# Patient Record
Sex: Female | Born: 2000 | Hispanic: Yes | Marital: Single | Smoking: Never smoker
Health system: Southern US, Community
[De-identification: ages and names within clinical notes are randomized; demographics above are authoritative.]

## PROBLEM LIST (undated history)

## (undated) DIAGNOSIS — F419 Anxiety disorder, unspecified: Secondary | ICD-10-CM

## (undated) HISTORY — DX: Anxiety disorder, unspecified: F41.9

## (undated) HISTORY — PX: NO PAST SURGERIES: SHX2092

---
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
## 2018-06-17 IMAGING — DX DG CHEST 2V
2 series · 2 of 2 positions shown · non-contrast
Comparison: None.

CLINICAL DATA: Cough, fever, chills and body aches.

EXAM:
CHEST  2 VIEW

[chest pa]
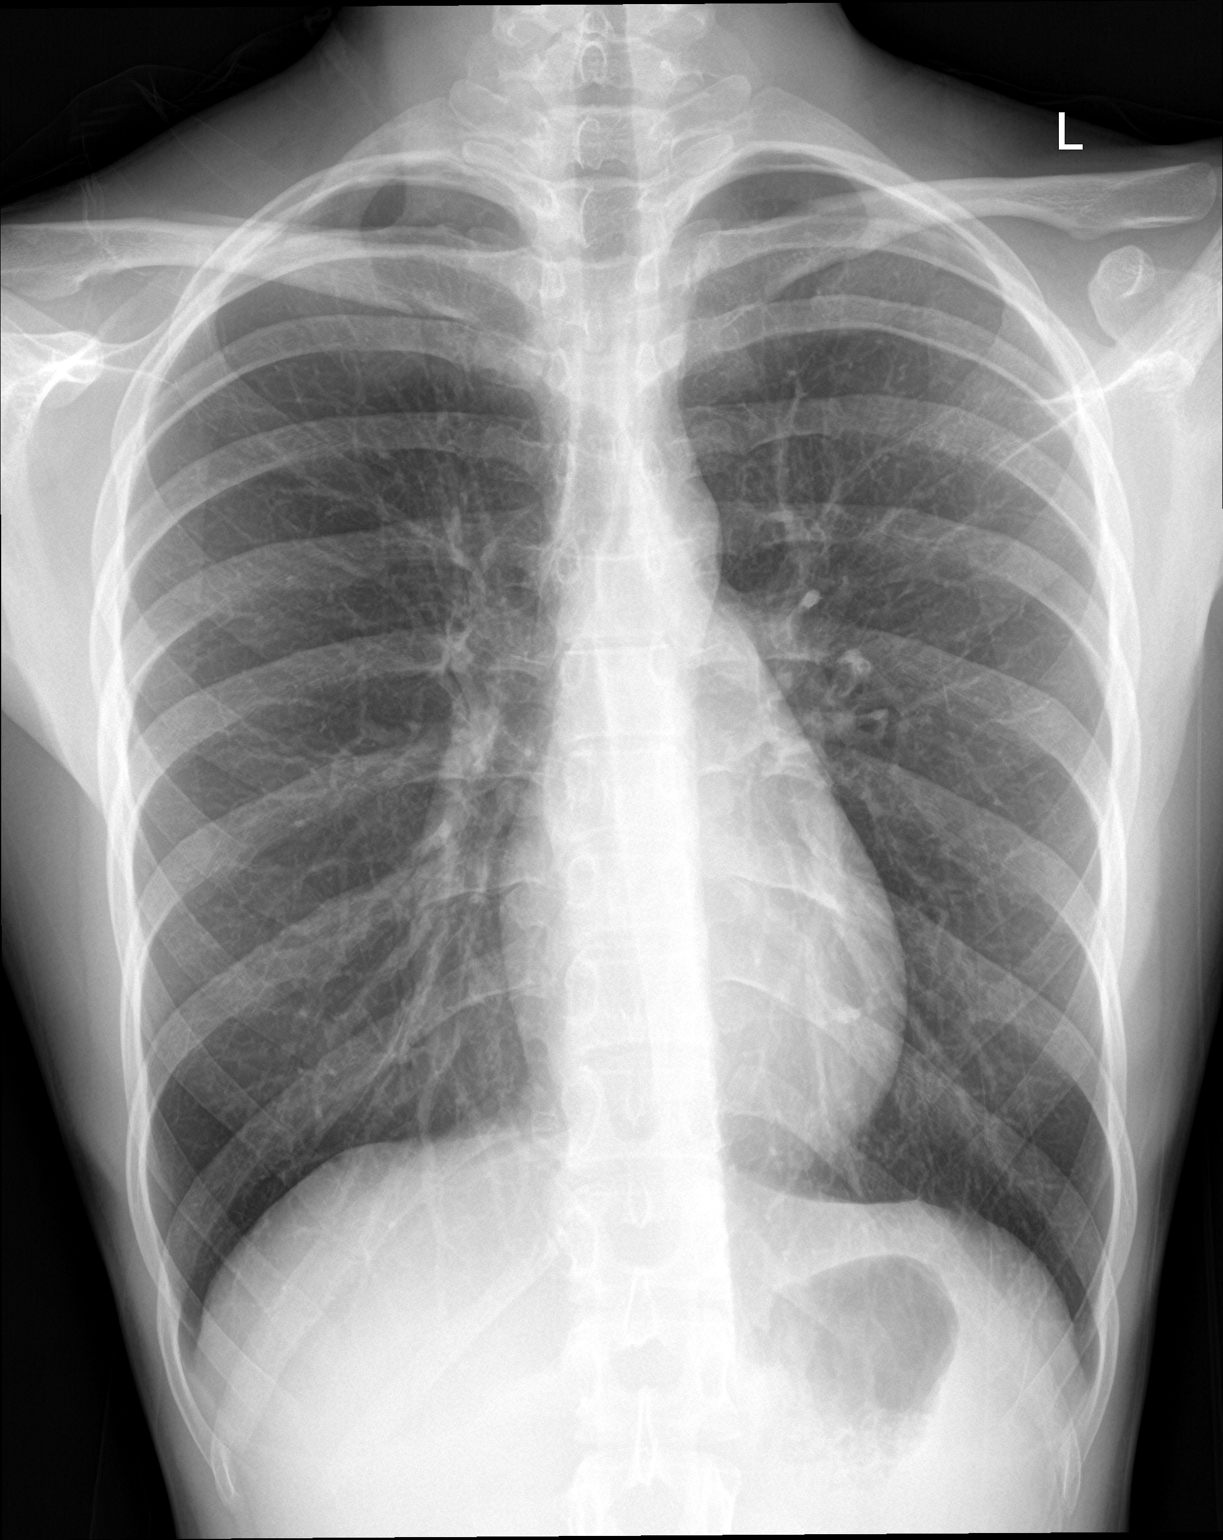

[chest lat]
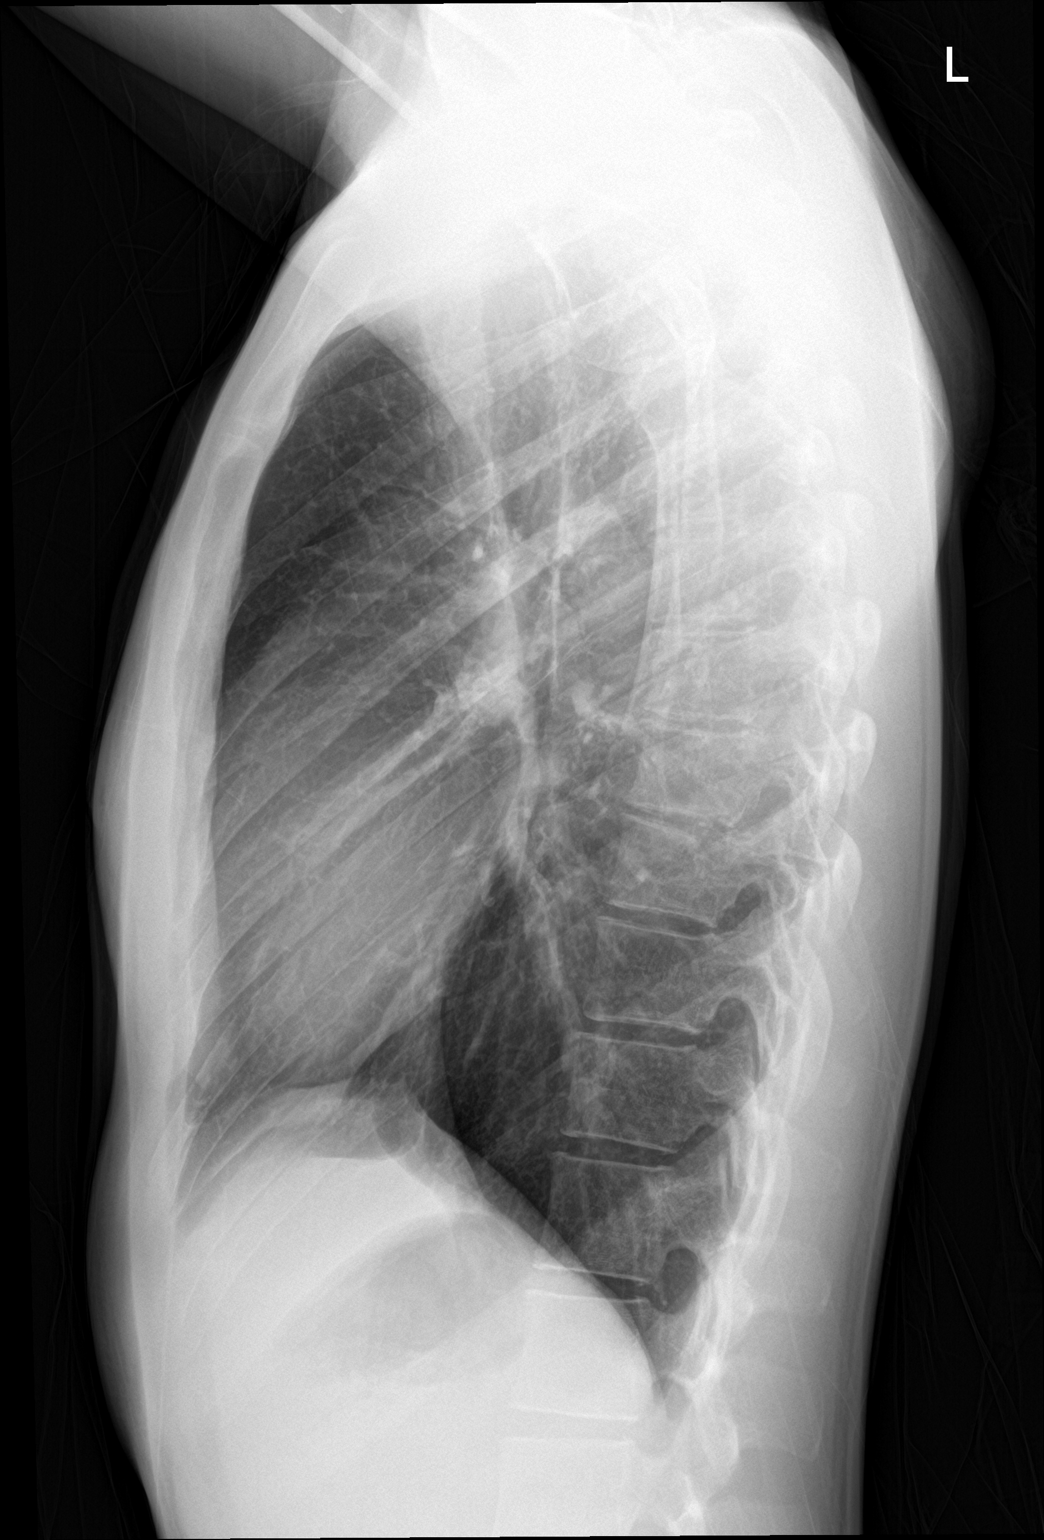

[2 of 2 positions shown; findings below may reference images not displayed]

FINDINGS: The cardiomediastinal contours are normal. The lungs are clear.
Pulmonary vasculature is normal. No consolidation, pleural effusion,
or pneumothorax. No acute osseous abnormalities are seen.
IMPRESSION: No active cardiopulmonary disease.

## 2020-06-05 ENCOUNTER — Ambulatory Visit: Payer: Self-pay | Admitting: Obstetrics and Gynecology

## 2020-07-16 ENCOUNTER — Encounter: Payer: Self-pay | Admitting: Obstetrics and Gynecology

## 2020-07-16 ENCOUNTER — Other Ambulatory Visit: Payer: Self-pay

## 2020-07-16 ENCOUNTER — Ambulatory Visit (INDEPENDENT_AMBULATORY_CARE_PROVIDER_SITE_OTHER): Payer: Medicaid Other | Admitting: Obstetrics and Gynecology

## 2020-07-16 ENCOUNTER — Other Ambulatory Visit (HOSPITAL_COMMUNITY)
Admission: RE | Admit: 2020-07-16 | Discharge: 2020-07-16 | Disposition: A | Discharge status: Home / Self Care | Payer: Medicaid Other | Source: Ambulatory Visit | Attending: Obstetrics and Gynecology | Admitting: Obstetrics and Gynecology

## 2020-07-16 VITALS — BP 105/67 | HR 97 | Ht 65.0 in | Wt 116.0 lb

## 2020-07-16 DIAGNOSIS — N898 Other specified noninflammatory disorders of vagina: Secondary | ICD-10-CM

## 2020-07-16 DIAGNOSIS — Z3009 Encounter for other general counseling and advice on contraception: Secondary | ICD-10-CM

## 2020-07-16 DIAGNOSIS — Z113 Encounter for screening for infections with a predominantly sexual mode of transmission: Secondary | ICD-10-CM | POA: Insufficient documentation

## 2020-07-16 DIAGNOSIS — R6882 Decreased libido: Secondary | ICD-10-CM

## 2020-07-16 DIAGNOSIS — Z01419 Encounter for gynecological examination (general) (routine) without abnormal findings: Secondary | ICD-10-CM | POA: Diagnosis not present

## 2020-07-16 NOTE — Progress Notes (Addendum)
Referral from [facility]: New GYN presents for pelvic pain, UTI after sex, diminished sex drive x 3 years. She is on DEPO for [facility].  Patient states that all her Urine Cultures have been Negative.  PHQ-9=14

## 2020-07-16 NOTE — Progress Notes (Signed)
GYNECOLOGY ANNUAL PREVENTATIVE CARE ENCOUNTER NOTE  Subjective:   Sheryl Mcconnell is a 19 y.o. G0P0000 female here for a annual gynecologic exam. Current complaints: frequent UTI and low libido.    Pt reports she had 6-7 UTIs in a year, was diagnosed by PCP the first time by culture. One other time, she had a positive urine culture and was treated. All the other times, she called and requested antibiotics and did not have culture positive. Was sent to Urology, who told her she did not have UTIs and to see GYN. Last had symptoms several weeks prior to Urology appointment.   Also concerned about libido. Had some libido age [age] but since then, has very low sex drive. Doesn't ever have any desire. Uses vibrator during intercourse for climax. States she orgasms very quickly once she "feels something." Is interfering with her relationship because she has no desire and no desire for sex. Has to use lube with intercourse.   Also has "bumps" that appear in her skin, once a month, one at a time. Painful with pressure.  Has been on OCPs since age 62, has been on Depo for 4 months. No UTI since switching to Depo.    Denies abnormal vaginal bleeding, discharge, pelvic pain, problems with intercourse or other gynecologic concerns.    Gynecologic History No LMP recorded. Patient has had an injection. Contraception: Depo-Provera injections Last Pap: n/a Last mammogram: n/a Gardisil: has not received  Obstetric History OB History  Gravida Para Term Preterm AB Living  0 0 0 0 0 0  SAB TAB Ectopic Multiple Live Births  0 0 0 0 0    Past Medical History:  Diagnosis Date  . Anxiety     Past Surgical History:  Procedure Laterality Date  . NO PAST SURGERIES      No current outpatient medications on file prior to visit.   No current facility-administered medications on file prior to visit.    No Known Allergies  Social History   Socioeconomic History  . Marital status: Single     Spouse name: Not on file  . Number of children: Not on file  . Years of education: Not on file  . Highest education level: Not on file  Occupational History  . Not on file  Tobacco Use  . Smoking status: Never Smoker  . Smokeless tobacco: Never Used  Substance and Sexual Activity  . Alcohol use: No  . Drug use: No  . Sexual activity: Yes    Birth control/protection: Injection  Other Topics Concern  . Not on file  Social History Narrative  . Not on file   Social Determinants of Health   Financial Resource Strain:   . Difficulty of Paying Living Expenses:   Food Insecurity:   . Worried About Programme researcher, broadcasting/film/video in the Last Year:   . Barista in the Last Year:   Transportation Needs:   . Freight forwarder (Medical):   Marland Kitchen Lack of Transportation (Non-Medical):   Physical Activity:   . Days of Exercise per Week:   . Minutes of Exercise per Session:   Stress:   . Feeling of Stress :   Social Connections:   . Frequency of Communication with Friends and Family:   . Frequency of Social Gatherings with Friends and Family:   . Attends Religious Services:   . Active Member of Clubs or Organizations:   . Attends Banker Meetings:   Marland Kitchen Marital  Status:   Intimate Partner Violence:   . Fear of Current or Ex-Partner:   . Emotionally Abused:   Marland Kitchen Physically Abused:   . Sexually Abused:     Family History  Problem Relation Age of Onset  . Cancer Paternal Uncle   . Cancer Maternal Grandmother   . Cancer Paternal Grandmother   . Cancer Paternal Grandfather     The following portions of the patient's history were reviewed and updated as appropriate: allergies, current medications, past family history, past medical history, past social history, past surgical history and problem list.  Review of Systems Pertinent items are noted in HPI.   Objective:  BP 105/67   Pulse 97   Ht 5\' 5"  (1.651 m)   Wt 116 lb (52.6 kg)   BMI 19.30 kg/m  CONSTITUTIONAL:  Well-developed, well-nourished female in no acute distress.  HENT:  Normocephalic, atraumatic, External right and left ear normal. Oropharynx is clear and moist EYES: Conjunctivae and EOM are normal. Pupils are equal, round, and reactive to light. No scleral icterus.  NECK: Normal range of motion, supple, no masses.  Normal thyroid.  SKIN: Skin is warm and dry. No rash noted. Not diaphoretic. No erythema. No pallor. NEUROLOGIC: Alert and oriented to person, place, and time. Normal reflexes, muscle tone coordination. No cranial nerve deficit noted. PSYCHIATRIC: Normal mood and affect. Normal behavior. Normal judgment and thought content. CARDIOVASCULAR: Normal heart rate noted RESPIRATORY: Effort normal, no problems with respiration noted. BREASTS: Symmetric in size. No masses, skin changes, nipple drainage, or lymphadenopathy. ABDOMEN: Soft, no distention noted.  No tenderness, rebound or guarding.  PELVIC: Normal appearing external genitalia; normal appearing vaginal mucosa and cervix.  No abnormal discharge noted.  Pelvic cultures obtained. Normal uterine size, no other palpable masses, no uterine or adnexal tenderness. MUSCULOSKELETAL: Normal range of motion. No tenderness.  No cyanosis, clubbing, or edema.  2+ distal pulses.  Exam done with chaperone present.  Assessment and Plan:   1. Routine screening for STI (sexually transmitted infection) - HIV antibody (with reflex) - Hepatitis C antibody - Cervicovaginal ancillary only( [place]) - RPR - Hepatitis B surface antigen  2. Well woman exam Healthy exam  3. Low libido - Suspect related to contraception use as it started when she started OCPs - Recommend change contraception and if no improvement, may refer for mental health and additional lab work - reviewed options for contraception, she will make decision about method she would prefer and will let me know  4. Encounter for counseling regarding contraception - pt very  concerned about not getting pregnant, reviewed efficacy of methods and different methdds she may opt for - to call when she makes a decision  5. Vaginal lesion - none currently - suspect sebaceous cyst, no cause for concern - return if worsening   Will follow up results of STI screen and manage accordingly. Encouraged improvement in diet and exercise.  Desires STI screen. COVID vaccine declines Mammogram n/a Referral for colonoscopy n/a Flu vaccine n/q Gardisil : unsure, patient will check  Routine preventative health maintenance measures emphasized. Please refer to After Visit Summary for other counseling recommendations.   Total face-to-face time with patient: 40 minutes. Over 50% of encounter was spent on counseling and coordination of care.   , M.D. Attending [facility] [employer])

## 2020-07-17 ENCOUNTER — Encounter: Payer: Self-pay | Admitting: Obstetrics and Gynecology

## 2020-07-17 LAB — CERVICOVAGINAL ANCILLARY ONLY
Bacterial Vaginitis (gardnerella): NEGATIVE
Candida Glabrata: NEGATIVE
Candida Vaginitis: NEGATIVE
Chlamydia: NEGATIVE
Comment: NEGATIVE
Comment: NEGATIVE
Comment: NEGATIVE
Comment: NEGATIVE
Comment: NEGATIVE
Comment: NORMAL
Neisseria Gonorrhea: NEGATIVE
Trichomonas: NEGATIVE

## 2020-07-17 LAB — HIV ANTIBODY (ROUTINE TESTING W REFLEX): HIV Screen 4th Generation wRfx: NONREACTIVE

## 2020-07-17 LAB — RPR: RPR Ser Ql: NONREACTIVE

## 2020-07-17 LAB — HEPATITIS B SURFACE ANTIGEN: Hepatitis B Surface Ag: NEGATIVE

## 2020-07-17 LAB — HEPATITIS C ANTIBODY: Hep C Virus Ab: <0.1 s/co ratio (ref 0.0–0.9)

## 2020-07-18 ENCOUNTER — Telehealth: Payer: Self-pay

## 2020-07-18 NOTE — Telephone Encounter (Addendum)
-----   Message from Conan Bowens, MD sent at 07/17/2020  4:48 PM EDT ----- Neg HIV, Syphilis, Hepatitis B & C  Neg gonorrhea, chlamydia, and trichomonas  Please call and let patient know   Called pt; results given. Pt states she will contact the office when she has made a decision regarding birth control method.

## 2020-11-11 ENCOUNTER — Other Ambulatory Visit: Payer: Self-pay

## 2020-11-11 ENCOUNTER — Encounter: Payer: Self-pay | Admitting: Obstetrics and Gynecology

## 2020-11-11 ENCOUNTER — Ambulatory Visit (INDEPENDENT_AMBULATORY_CARE_PROVIDER_SITE_OTHER): Payer: Medicaid Other | Admitting: Obstetrics and Gynecology

## 2020-11-11 ENCOUNTER — Other Ambulatory Visit (HOSPITAL_COMMUNITY)
Admission: RE | Admit: 2020-11-11 | Discharge: 2020-11-11 | Disposition: A | Discharge status: Home / Self Care | Payer: Medicaid Other | Source: Ambulatory Visit | Attending: Obstetrics and Gynecology | Admitting: Obstetrics and Gynecology

## 2020-11-11 ENCOUNTER — Other Ambulatory Visit: Payer: Self-pay | Admitting: Obstetrics and Gynecology

## 2020-11-11 VITALS — BP 108/73 | HR 96 | Ht 65.0 in | Wt 120.3 lb

## 2020-11-11 DIAGNOSIS — N898 Other specified noninflammatory disorders of vagina: Secondary | ICD-10-CM | POA: Diagnosis not present

## 2020-11-11 DIAGNOSIS — Z01419 Encounter for gynecological examination (general) (routine) without abnormal findings: Secondary | ICD-10-CM

## 2020-11-11 DIAGNOSIS — N3010 Interstitial cystitis (chronic) without hematuria: Secondary | ICD-10-CM

## 2020-11-11 LAB — POCT URINALYSIS DIPSTICK
Bilirubin, UA: NEGATIVE
Blood, UA: NEGATIVE
Glucose, UA: NEGATIVE
Ketones, UA: NEGATIVE
Leukocytes, UA: NEGATIVE
Nitrite, UA: NEGATIVE
Protein, UA: NEGATIVE
Spec Grav, UA: 1.015 (ref 1.010–1.025)
Urobilinogen, UA: 0.2 E.U./dL
pH, UA: 6.5 (ref 5.0–8.0)

## 2020-11-11 MED ORDER — AMITRIPTYLINE HCL 10 MG PO TABS: 25.0000 mg | ORAL_TABLET | Freq: Every day | ORAL | Status: DC

## 2020-11-11 MED ORDER — AMITRIPTYLINE HCL 25 MG PO TABS: 25.0000 mg | ORAL_TABLET | Freq: Every day | ORAL | 1 refills | Status: DC

## 2020-11-11 NOTE — Patient Instructions (Signed)
Interstitial Cystitis  Interstitial cystitis is inflammation of the bladder. This may cause pain in the bladder area as well as a frequent and urgent need to urinate. The bladder is a hollow organ in the lower part of the abdomen. It stores urine after the urine is made in the kidneys. The severity of interstitial cystitis can vary from person to person. You may have flare-ups, and then your symptoms may go away for a while. For many people, it becomes a long-term (chronic) problem. What are the causes? The cause of this condition is not known. What increases the risk? The following factors may make you more likely to develop this condition:  You are female.  You have fibromyalgia.  You have irritable bowel syndrome (IBS).  You have endometriosis. This condition may be aggravated by:  Stress.  Smoking.  Spicy foods. What are the signs or symptoms? Symptoms of interstitial cystitis vary, and they can change over time. Symptoms may include:  Discomfort or pain in the bladder area, which is in the lower abdomen. Pain can range from mild to severe. The pain may change in intensity as the bladder fills with urine or as it empties.  Pain in the pelvic area, between the hip bones.  An urgent need to urinate.  Frequent urination.  Pain during urination.  Pain during sex.  Blood in the urine. For women, symptoms often get worse during menstruation. How is this diagnosed? This condition is diagnosed based on your symptoms, your medical history, and a physical exam. You may have tests to rule out other conditions, such as:  Urine tests.  Cystoscopy. For this test, a tool similar to a very thin telescope is used to look into your bladder.  Biopsy. This involves taking a sample of tissue from the bladder to be examined under a microscope. How is this treated? There is no cure for this condition, but treatment can help you control your symptoms. Work closely with your health care  provider to find the most effective treatments for you. Treatment options may include:  Medicines to relieve pain and reduce how often you feel the need to urinate.  Learning ways to control when you urinate (bladder training).  Lifestyle changes, such as changing your diet or taking steps to control stress.  Using a device that provides electrical stimulation to your nerves, which can relieve pain (neuromodulation therapy). The device is placed on your back, where it blocks the nerves that cause you to feel pain in your bladder area.  A procedure that stretches your bladder by filling it with air or fluid.  Surgery. This is rare. It is only done for extreme cases, if other treatments do not help. Follow these instructions at home: Bladder training   Use bladder training techniques as directed. Techniques may include: ? Urinating at scheduled times. ? Training yourself to delay urination. ? Doing exercises (Kegel exercises) to strengthen the muscles that control urine flow.  Keep a bladder diary. ? Write down the times that you urinate and any symptoms that you have. This can help you find out which foods, liquids, or activities make your symptoms worse. ? Use your bladder diary to schedule bathroom trips. If you are away from home, plan to be near a bathroom at each of your scheduled times.  Make sure that you urinate just before you leave the house and just before you go to bed. Eating and drinking  Make dietary changes as recommended by your health care provider. You   may need to avoid: ? Spicy foods. ? Foods that contain a lot of potassium.  Limit your intake of beverages that make you need to urinate. These include: ? Caffeinated beverages like soda, coffee, and tea. ? Alcohol. General instructions  Take over-the-counter and prescription medicines only as told by your health care provider.  Do not drink alcohol.  You can try a warm or cool compress over your bladder for  comfort.  Avoid wearing tight clothing.  Do not use any products that contain nicotine or tobacco, such as cigarettes and e-cigarettes. If you need help quitting, ask your health care provider.  Keep all follow-up visits as told by your health care provider. This is important. Contact a health care provider if you have:  Symptoms that do not get better with treatment.  Pain or discomfort that gets worse.  More frequent urges to urinate.  A fever. Get help right away if:  You have no control over when you urinate. Summary  Interstitial cystitis is inflammation of the bladder.  This condition may cause pain in the bladder area as well as a frequent and urgent need to urinate.  You may have flare-ups of the condition, and then it may go away for a while. For many people, it becomes a long-term (chronic) problem.  There is no cure for interstitial cystitis, but treatment methods are available to control your symptoms. This information is not intended to replace advice given to you by your health care provider. Make sure you discuss any questions you have with your health care provider. Document Revised: 11/04/2017 Document Reviewed: 10/17/2017 Elsevier Patient Education  2020 Elsevier Inc.  

## 2020-11-11 NOTE — Progress Notes (Signed)
Patient presents for UTI symptoms. She states that she has been having a UTI constantly for about a year. Was seen by a Urologist who states that current sx are not associated with UTI, and she was referred to [place]. Patient was seen in August, and states that the provider told her that she was not having UTIs. She was told that the sx that she is experiencing is from the birth control Depo which is causing vaginal dryness and irritation. Patient was told that the best option for contraception is the non hormonal IUD, but patient does not want to do this. She states that when she went to the ER she tested positive for UTI.   She complains of having dysuria, urinary frequency, and urgency. She had a  shot on 12/1 and is currently taking an antibiotic cefdinir.

## 2020-11-11 NOTE — Addendum Note (Signed)
Addended by: Johnny Bridge on: 11/11/2020 05:03 PM   Modules accepted: Orders

## 2020-11-11 NOTE — Progress Notes (Signed)
Subjective:    Patient ID: Sheryl Mcconnell, female    DOB: August 20, 2001, 19 y.o.   MRN: [id number]  Patient presents c/o burning pain with urination and urinary urgency since 2019.  She also complains of urinary frequency with post-void dribbling.  The patient states that she was initially diagnosed with an UTI.  The symptoms resolved with antibiotics and then recurred a month later.  She was again treated with antibiotics.  This continued for about 4-5 months.  The patient was then referred to urology who noticed that only a few of her urine cultures showed actual infection.  He then referred the patient to gynecology.  During the initial evaluation with gynecology, the patient was offered a non-hormonal form of contraception instead of the current use of Depo.  The patient does not desire to change contraception.  She states that she has a normal physiological response to sexual stimuli.  The patient reports that she has always had a low sex drive.       Review of Systems  Constitutional: Negative for activity change, chills and fever.  Eyes: Negative for photophobia and visual disturbance.  Respiratory: Negative for shortness of breath.   Cardiovascular: Negative for chest pain.  Gastrointestinal: Negative for abdominal pain, constipation, diarrhea, nausea, rectal pain and vomiting.  Endocrine: Negative for cold intolerance, heat intolerance, polydipsia and polyphagia.  Genitourinary: Negative for flank pain, genital sores, vaginal bleeding and vaginal discharge.  Musculoskeletal: Negative for back pain, joint swelling and myalgias.  Skin: Negative for rash.  Neurological: Negative for light-headedness and headaches.  Hematological: Negative for adenopathy. Does not bruise/bleed easily.  Psychiatric/Behavioral: Negative for dysphoric mood, sleep disturbance and suicidal ideas. The patient is not nervous/anxious.        Objective:   Physical Exam Constitutional:      General: She is not in  acute distress.    Appearance: Normal appearance.  HENT:     Head: Normocephalic and atraumatic.  Abdominal:     General: Abdomen is flat. Bowel sounds are normal. There is no distension.     Palpations: Abdomen is soft. There is no mass.     Tenderness: There is no abdominal tenderness.  Genitourinary:    General: Normal vulva.     Exam position: Lithotomy position.     Pubic Area: No rash.      Labia:        Right: No tenderness, lesion or injury.        Left: No tenderness, lesion or injury.      Urethra: No prolapse, urethral pain, urethral swelling or urethral lesion.     Vagina: No foreign body. Vaginal discharge present. No tenderness or lesions.     Cervix: No cervical motion tenderness or friability.     Uterus: Normal.      Adnexa: Right adnexa normal and left adnexa normal.       Right: No mass or tenderness.         Left: No mass or tenderness.    Lymphadenopathy:     Lower Body: No right inguinal adenopathy. No left inguinal adenopathy.  Neurological:     Mental Status: She is alert.           Assessment & Plan:   Encounter Diagnoses  Name Primary?  . Interstitial cystitis Yes  . Vaginal discharge   . Pap test, as part of routine gynecological examination    - Discussed with patient the etiology and treatment of IC. - Rx for  Amitriptyline given. - We will fu on Pap and genital cultures. - Patient to fu in 2 weeks.

## 2020-11-12 LAB — CERVICOVAGINAL ANCILLARY ONLY: Bacterial Vaginitis (gardnerella): NEGATIVE

## 2020-11-13 LAB — CERVICOVAGINAL ANCILLARY ONLY
Candida Glabrata: NEGATIVE
Candida Vaginitis: NEGATIVE
Comment: NEGATIVE
Comment: NEGATIVE
Comment: NEGATIVE

## 2020-11-13 LAB — CYTOLOGY - PAP: Diagnosis: NEGATIVE

## 2020-11-18 ENCOUNTER — Telehealth: Payer: Self-pay

## 2020-11-18 NOTE — Telephone Encounter (Signed)
Patient called and left message on triage vm at 1609 on 12/13 with questions regarding depo  Returned call at 11:20. No answer. Unable to leave message due to vm box not being set up

## 2020-11-25 ENCOUNTER — Telehealth (INDEPENDENT_AMBULATORY_CARE_PROVIDER_SITE_OTHER): Payer: Medicaid Other | Admitting: Obstetrics and Gynecology

## 2020-11-25 ENCOUNTER — Encounter: Payer: Self-pay | Admitting: Obstetrics and Gynecology

## 2020-11-25 DIAGNOSIS — N3010 Interstitial cystitis (chronic) without hematuria: Secondary | ICD-10-CM | POA: Diagnosis not present

## 2020-11-25 NOTE — Progress Notes (Signed)
TELEHEALTH GYNECOLOGY VISIT ENCOUNTER NOTE  I connected with Sheryl Mcconnell on 11/25/20 at  2:30 PM EST by telephone at home and verified that I am speaking with the correct person using two identifiers.  I was located at the [facility].     I discussed the limitations, risks, security and privacy concerns of performing an evaluation and management service by telephone and the availability of in person appointments. I also discussed with the patient that there may be a patient responsible charge related to this service. The patient expressed understanding and agreed to proceed.   History:  Sheryl Mcconnell is a 19 y.o. G0P0000 female being evaluated today for follow up of interstitial cystitis. She denies any abnormal vaginal discharge, bleeding, pelvic pain or other concerns.  Since starting the Elavil 25mg  PO qhs, the patient states that her symptoms have completely resolved.     Past Medical History:  Diagnosis Date  . Anxiety    Past Surgical History:  Procedure Laterality Date  . NO PAST SURGERIES     The following portions of the patient's history were reviewed and updated as appropriate: allergies, current medications, past family history, past medical history, past social history, past surgical history and problem list.     Review of Systems:  Pertinent items noted in HPI and remainder of comprehensive ROS otherwise negative.  Physical Exam:   General:  Alert, oriented and cooperative.   Mental Status: Normal mood and affect perceived. Normal judgment and thought content.  Physical exam deferred due to nature of the encounter  Labs and Imaging Results for orders placed or performed in visit on 11/11/20 (from the past 336 hour(s))  Cytology - PAP( [facility])   Collection Time: 11/11/20  4:40 PM  Result Value Ref Range   Adequacy      Satisfactory for evaluation; transformation zone component PRESENT.   Diagnosis      - Negative for intraepithelial lesion or malignancy  (NILM)  Cervicovaginal ancillary only( [facility])   Collection Time: 11/11/20  4:40 PM  Result Value Ref Range   Bacterial Vaginitis (gardnerella) Negative    Candida Vaginitis Negative    Candida Glabrata Negative    Comment      Normal Reference Range Bacterial Vaginosis - Negative   Comment Normal Reference Range Candida Species - Negative    Comment Normal Reference Range Candida Galbrata - Negative   POCT Urinalysis Dipstick   Collection Time: 11/11/20  4:41 PM  Result Value Ref Range   Color, UA Straw    Clarity, UA Clear    Glucose, UA Negative Negative   Bilirubin, UA Negative    Ketones, UA Negative    Spec Grav, UA 1.015 1.010 - 1.025   Blood, UA Negative    pH, UA 6.5 5.0 - 8.0   Protein, UA Negative Negative   Urobilinogen, UA 0.2 0.2 or 1.0 E.U./dL   Nitrite, UA Negative    Leukocytes, UA Negative Negative   Appearance     Odor     No results found.    Assessment and Plan:     1. Interstitial cystitis - Now asymptomatic on Elavil. - Patient to continue current medications. - She will fu in symptoms return.         I discussed the assessment and treatment plan with the patient. The patient was provided an opportunity to ask questions and all were answered. The patient agreed with the plan and demonstrated an understanding of the instructions.  The patient was advised to call back or seek an in-person evaluation/go to the ED if the symptoms worsen or if the condition fails to improve as anticipated.  I provided 15 minutes of non-face-to-face time during this encounter.   Johnny Bridge, MD Center for [facility], [facility]

## 2020-11-25 NOTE — Progress Notes (Signed)
S/w pt for virtual visit, pt reports that symptoms have inmproved since last visit.

## 2020-12-18 ENCOUNTER — Telehealth: Payer: Self-pay

## 2020-12-18 NOTE — Telephone Encounter (Signed)
Pt called office for results from last exam. Pt made aware of normal results.

## 2021-01-07 ENCOUNTER — Other Ambulatory Visit: Payer: Self-pay | Admitting: Obstetrics and Gynecology

## 2021-01-07 DIAGNOSIS — N3010 Interstitial cystitis (chronic) without hematuria: Secondary | ICD-10-CM

## 2021-02-02 ENCOUNTER — Telehealth (INDEPENDENT_AMBULATORY_CARE_PROVIDER_SITE_OTHER): Payer: Medicaid Other | Admitting: Obstetrics and Gynecology

## 2021-02-02 ENCOUNTER — Encounter: Payer: Self-pay | Admitting: Obstetrics and Gynecology

## 2021-02-02 DIAGNOSIS — N3010 Interstitial cystitis (chronic) without hematuria: Secondary | ICD-10-CM | POA: Diagnosis not present

## 2021-02-02 MED ORDER — AMITRIPTYLINE HCL 25 MG PO TABS: 25.0000 mg | ORAL_TABLET | Freq: Every day | ORAL | 3 refills | Status: DC

## 2021-02-02 MED ORDER — AMITRIPTYLINE HCL 25 MG PO TABS: 25.0000 mg | ORAL_TABLET | Freq: Every day | ORAL | 3 refills | Status: AC

## 2021-02-02 NOTE — Progress Notes (Signed)
    GYNECOLOGY VIRTUAL VISIT ENCOUNTER NOTE  Provider location: [facility] for [facility] [place]   I connected with Sheryl Mcconnell on 02/02/21 at  1:30 PM EST by MyChart Video Encounter at home and verified that I am speaking with the correct person using two identifiers.   I discussed the limitations, risks, security and privacy concerns of performing an evaluation and management service virtually and the availability of in person appointments. I also discussed with the patient that there may be a patient responsible charge related to this service. The patient expressed understanding and agreed to proceed.   History:  Sheryl Mcconnell is a 20 y.o. G0P0000 female being evaluated today for follow up on interstitial cystitis. She denies any abnormal vaginal discharge, bleeding, pelvic pain or other concerns. She reports continued improvement in her interstitial cystitis with Elavil. Patient is sexually active using depo-provera.       Past Medical History:  Diagnosis Date  . Anxiety    Past Surgical History:  Procedure Laterality Date  . NO PAST SURGERIES     The following portions of the patient's history were reviewed and updated as appropriate: allergies, current medications, past family history, past medical history, past social history, past surgical history and problem list.   Health Maintenance:  Normal pap and negative HRHPV on 11/2020.     Review of Systems:  Pertinent items noted in HPI and remainder of comprehensive ROS otherwise negative.  Physical Exam:   General:  Alert, oriented and cooperative. Patient appears to be in no acute distress.  Mental Status: Normal mood and affect. Normal behavior. Normal judgment and thought content.   Respiratory: Normal respiratory effort, no problems with respiration noted  Rest of physical exam deferred due to type of encounter  Labs and Imaging No results found for this or any previous visit (from the past 336 hour(s)). No  results found.     Assessment and Plan:     1. Interstitial cystitis Continue Elavil as needed RTC prn        I discussed the assessment and treatment plan with the patient. The patient was provided an opportunity to ask questions and all were answered. The patient agreed with the plan and demonstrated an understanding of the instructions.   The patient was advised to call back or seek an in-person evaluation/go to the ED if the symptoms worsen or if the condition fails to improve as anticipated.  I provided 20 minutes of face-to-face time during this encounter.   Catalina Antigua, MD Center for [employer], [facility]

## 2021-02-02 NOTE — Addendum Note (Signed)
Addended by: Catalina Antigua on: 02/02/2021 01:37 PM   Modules accepted: Orders

## 2021-02-02 NOTE — Progress Notes (Signed)
Follow up on cystitis

## 2022-02-14 ENCOUNTER — Other Ambulatory Visit: Payer: Self-pay | Admitting: Obstetrics and Gynecology

## 2022-02-14 DIAGNOSIS — N3010 Interstitial cystitis (chronic) without hematuria: Secondary | ICD-10-CM
